# Patient Record
Sex: Male | Born: 1995 | Hispanic: Yes | Marital: Single | Smoking: Never smoker
Health system: Southern US, Community
[De-identification: ages and names within clinical notes are randomized; demographics above are authoritative.]

---

## 2019-05-30 ENCOUNTER — Other Ambulatory Visit: Payer: Self-pay

## 2019-05-30 ENCOUNTER — Emergency Department (HOSPITAL_COMMUNITY): Payer: 59

## 2019-05-30 ENCOUNTER — Observation Stay (HOSPITAL_COMMUNITY)
Admission: EM | Admit: 2019-05-30 | Discharge: 2019-05-31 | DRG: 987 | Disposition: A | Discharge status: Home / Self Care | Payer: 59 | Attending: General Surgery | Admitting: General Surgery

## 2019-05-30 ENCOUNTER — Encounter (HOSPITAL_COMMUNITY): Payer: Self-pay | Admitting: *Deleted

## 2019-05-30 DIAGNOSIS — T2114XA Burn of first degree of lower back, initial encounter: Secondary | ICD-10-CM | POA: Diagnosis present

## 2019-05-30 DIAGNOSIS — S22059A Unspecified fracture of T5-T6 vertebra, initial encounter for closed fracture: Secondary | ICD-10-CM | POA: Diagnosis present

## 2019-05-30 DIAGNOSIS — S42022A Displaced fracture of shaft of left clavicle, initial encounter for closed fracture: Secondary | ICD-10-CM | POA: Diagnosis not present

## 2019-05-30 DIAGNOSIS — T20212A Burn of second degree of left ear [any part, except ear drum], initial encounter: Secondary | ICD-10-CM | POA: Diagnosis not present

## 2019-05-30 DIAGNOSIS — Y92410 Unspecified street and highway as the place of occurrence of the external cause: Secondary | ICD-10-CM

## 2019-05-30 DIAGNOSIS — S36113A Laceration of liver, unspecified degree, initial encounter: Secondary | ICD-10-CM | POA: Diagnosis present

## 2019-05-30 DIAGNOSIS — S42025A Nondisplaced fracture of shaft of left clavicle, initial encounter for closed fracture: Secondary | ICD-10-CM

## 2019-05-30 DIAGNOSIS — U071 COVID-19: Secondary | ICD-10-CM | POA: Diagnosis present

## 2019-05-30 DIAGNOSIS — F1290 Cannabis use, unspecified, uncomplicated: Secondary | ICD-10-CM | POA: Diagnosis present

## 2019-05-30 DIAGNOSIS — S22049A Unspecified fracture of fourth thoracic vertebra, initial encounter for closed fracture: Secondary | ICD-10-CM | POA: Diagnosis not present

## 2019-05-30 DIAGNOSIS — R7989 Other specified abnormal findings of blood chemistry: Secondary | ICD-10-CM | POA: Diagnosis present

## 2019-05-30 DIAGNOSIS — R52 Pain, unspecified: Secondary | ICD-10-CM

## 2019-05-30 DIAGNOSIS — S2232XA Fracture of one rib, left side, initial encounter for closed fracture: Principal | ICD-10-CM | POA: Diagnosis present

## 2019-05-30 LAB — COMPREHENSIVE METABOLIC PANEL
ALT: 249 U/L — ABNORMAL HIGH (ref 0–44)
AST: 257 U/L — ABNORMAL HIGH (ref 15–41)
Albumin: 3.9 g/dL (ref 3.5–5.0)
Alkaline Phosphatase: 61 U/L (ref 38–126)
Anion gap: 11 (ref 5–15)
BUN: 11 mg/dL (ref 6–20)
CO2: 21 mmol/L — ABNORMAL LOW (ref 22–32)
Calcium: 9 mg/dL (ref 8.9–10.3)
Chloride: 107 mmol/L (ref 98–111)
Creatinine, Ser: 0.8 mg/dL (ref 0.61–1.24)
GFR calc Af Amer: >60 mL/min (ref >60)
GFR calc non Af Amer: >60 mL/min (ref >60)
Glucose, Bld: 121 mg/dL — ABNORMAL HIGH (ref 70–99)
Potassium: 3.3 mmol/L — ABNORMAL LOW (ref 3.5–5.1)
Sodium: 139 mmol/L (ref 135–145)
Total Bilirubin: 0.7 mg/dL (ref 0.3–1.2)
Total Protein: 6.3 g/dL — ABNORMAL LOW (ref 6.5–8.1)

## 2019-05-30 LAB — CBC
HCT: 42.6 % (ref 39.0–52.0)
HCT: 47.7 % (ref 39.0–52.0)
Hemoglobin: 14.3 g/dL (ref 13.0–17.0)
Hemoglobin: 16.1 g/dL (ref 13.0–17.0)
MCH: 29 pg (ref 26.0–34.0)
MCH: 29 pg (ref 26.0–34.0)
MCHC: 33.6 g/dL (ref 30.0–36.0)
MCHC: 33.8 g/dL (ref 30.0–36.0)
MCV: 86.4 fL (ref 80.0–100.0)
MCV: 85.9 fL (ref 80.0–100.0)
Platelets: 236 10*3/uL (ref 150–400)
Platelets: 242 10*3/uL (ref 150–400)
RBC: 4.93 MIL/uL (ref 4.22–5.81)
RBC: 5.55 MIL/uL (ref 4.22–5.81)
RDW: 12.6 % (ref 11.5–15.5)
RDW: 12.6 % (ref 11.5–15.5)
WBC: 19.3 10*3/uL — ABNORMAL HIGH (ref 4.0–10.5)
WBC: 17.7 10*3/uL — ABNORMAL HIGH (ref 4.0–10.5)
nRBC: 0 % (ref 0.0–0.2)
nRBC: 0 % (ref 0.0–0.2)

## 2019-05-30 LAB — URINALYSIS, ROUTINE W REFLEX MICROSCOPIC
Bilirubin Urine: NEGATIVE
Glucose, UA: NEGATIVE mg/dL
Ketones, ur: NEGATIVE mg/dL
Leukocytes,Ua: NEGATIVE
Nitrite: NEGATIVE
Protein, ur: 100 mg/dL — AB
Specific Gravity, Urine: >1.046 — ABNORMAL HIGH (ref 1.005–1.030)
pH: 5 (ref 5.0–8.0)

## 2019-05-30 LAB — SARS CORONAVIRUS 2 (TAT 6-24 HRS): SARS Coronavirus 2: POSITIVE — AB

## 2019-05-30 LAB — PROTIME-INR
INR: 1.1 (ref 0.8–1.2)
Prothrombin Time: 13.6 seconds (ref 11.4–15.2)

## 2019-05-30 LAB — I-STAT CHEM 8, ED
BUN: 11 mg/dL (ref 6–20)
Calcium, Ion: 1.13 mmol/L — ABNORMAL LOW (ref 1.15–1.40)
Chloride: 108 mmol/L (ref 98–111)
Creatinine, Ser: 0.6 mg/dL — ABNORMAL LOW (ref 0.61–1.24)
Glucose, Bld: 115 mg/dL — ABNORMAL HIGH (ref 70–99)
HCT: 42 % (ref 39.0–52.0)
Hemoglobin: 14.3 g/dL (ref 13.0–17.0)
Potassium: 3.1 mmol/L — ABNORMAL LOW (ref 3.5–5.1)
Sodium: 140 mmol/L (ref 135–145)
TCO2: 20 mmol/L — ABNORMAL LOW (ref 22–32)

## 2019-05-30 LAB — LACTIC ACID, PLASMA: Lactic Acid, Venous: 2.6 mmol/L (ref 0.5–1.9)

## 2019-05-30 LAB — CDS SEROLOGY

## 2019-05-30 LAB — ETHANOL: Alcohol, Ethyl (B): <10 mg/dL (ref <10)

## 2019-05-30 LAB — CARBOXYHEMOGLOBIN - COOX: Carboxyhemoglobin: 3.1 % — ABNORMAL HIGH (ref 0.5–1.5)

## 2019-05-30 LAB — HIV ANTIBODY (ROUTINE TESTING W REFLEX): HIV Screen 4th Generation wRfx: NONREACTIVE

## 2019-05-30 MED ORDER — DOCUSATE SODIUM 100 MG PO CAPS: 100.0000 mg | ORAL_CAPSULE | Freq: Two times a day (BID) | ORAL | Status: DC

## 2019-05-30 MED ORDER — LIDOCAINE-EPINEPHRINE (PF) 1 %-1:200000 IJ SOLN
10.0000 mL | Freq: Once | INTRAMUSCULAR | Status: DC
  Filled 2019-05-30: qty 10

## 2019-05-30 MED ORDER — HYDROMORPHONE HCL 1 MG/ML IJ SOLN
1.0000 mg | Freq: Once | INTRAMUSCULAR | Status: AC
  Administered 2019-05-30: 1 mg via INTRAVENOUS
  Filled 2019-05-30: qty 1

## 2019-05-30 MED ORDER — METOPROLOL TARTRATE 5 MG/5ML IV SOLN: 5.0000 mg | Freq: Four times a day (QID) | INTRAVENOUS | Status: DC | PRN

## 2019-05-30 MED ORDER — ONDANSETRON 4 MG PO TBDP: 4.0000 mg | ORAL_TABLET | Freq: Four times a day (QID) | ORAL | Status: DC | PRN

## 2019-05-30 MED ORDER — SODIUM CHLORIDE 0.9 % IV BOLUS
1000.0000 mL | Freq: Once | INTRAVENOUS | Status: AC
  Administered 2019-05-30: 1000 mL via INTRAVENOUS

## 2019-05-30 MED ORDER — ONDANSETRON HCL 4 MG/2ML IJ SOLN
4.0000 mg | Freq: Four times a day (QID) | INTRAMUSCULAR | Status: DC | PRN
  Filled 2019-05-30: qty 2

## 2019-05-30 MED ORDER — METHOCARBAMOL 500 MG PO TABS
500.0000 mg | ORAL_TABLET | Freq: Three times a day (TID) | ORAL | Status: DC | PRN
  Administered 2019-05-30 (×2): 500 mg via ORAL
  Filled 2019-05-30 (×2): qty 1

## 2019-05-30 MED ORDER — DOUBLE ANTIBIOTIC 500-10000 UNIT/GM EX OINT
TOPICAL_OINTMENT | Freq: Two times a day (BID) | CUTANEOUS | Status: DC
  Administered 2019-05-30 – 2019-05-31 (×3): via TOPICAL
  Filled 2019-05-30: qty 28.4

## 2019-05-30 MED ORDER — ONDANSETRON HCL 4 MG/2ML IJ SOLN
4.0000 mg | Freq: Once | INTRAMUSCULAR | Status: AC
  Administered 2019-05-30: 4 mg via INTRAVENOUS

## 2019-05-30 MED ORDER — HYDROMORPHONE HCL 1 MG/ML IJ SOLN
1.0000 mg | Freq: Once | INTRAMUSCULAR | Status: AC
  Administered 2019-05-30: 1 mg via INTRAVENOUS

## 2019-05-30 MED ORDER — HYDROMORPHONE HCL 1 MG/ML IJ SOLN
INTRAMUSCULAR | Status: AC
  Filled 2019-05-30: qty 1

## 2019-05-30 MED ORDER — OXYCODONE HCL 5 MG PO TABS
5.0000 mg | ORAL_TABLET | ORAL | Status: DC | PRN
  Administered 2019-05-30: 5 mg via ORAL
  Filled 2019-05-30: qty 1

## 2019-05-30 MED ORDER — POTASSIUM CHLORIDE IN NACL 20-0.45 MEQ/L-% IV SOLN
INTRAVENOUS | Status: DC
  Administered 2019-05-30 – 2019-05-31 (×2): via INTRAVENOUS
  Filled 2019-05-30 (×2): qty 1000

## 2019-05-30 MED ORDER — TETANUS-DIPHTH-ACELL PERTUSSIS 5-2.5-18.5 LF-MCG/0.5 IM SUSP
0.5000 mL | Freq: Once | INTRAMUSCULAR | Status: AC
  Administered 2019-05-30: 0.5 mL via INTRAMUSCULAR
  Filled 2019-05-30: qty 0.5

## 2019-05-30 MED ORDER — ACETAMINOPHEN 325 MG PO TABS: 650.0000 mg | ORAL_TABLET | ORAL | Status: DC | PRN

## 2019-05-30 MED ORDER — SILVER SULFADIAZINE 1 % EX CREA
TOPICAL_CREAM | Freq: Once | CUTANEOUS | Status: AC
  Administered 2019-05-30: 22:00:00 via TOPICAL
  Filled 2019-05-30: qty 85

## 2019-05-30 MED ORDER — LIDOCAINE HCL (PF) 1 % IJ SOLN
30.0000 mL | Freq: Once | INTRAMUSCULAR | Status: AC
  Administered 2019-05-30: 30 mL via INTRADERMAL

## 2019-05-30 MED ORDER — LIDOCAINE HCL (PF) 1 % IJ SOLN
INTRAMUSCULAR | Status: AC
  Administered 2019-05-30: 16:00:00
  Filled 2019-05-30: qty 30

## 2019-05-30 MED ORDER — IOHEXOL 300 MG/ML  SOLN
100.0000 mL | Freq: Once | INTRAMUSCULAR | Status: AC | PRN
  Administered 2019-05-30: 100 mL via INTRAVENOUS

## 2019-05-30 MED ORDER — ONDANSETRON HCL 4 MG/2ML IJ SOLN
INTRAMUSCULAR | Status: AC
  Administered 2019-05-30: 4 mg
  Filled 2019-05-30: qty 2

## 2019-05-30 MED ORDER — LIDOCAINE-EPINEPHRINE 1 %-1:100000 IJ SOLN
10.0000 mL | Freq: Once | INTRAMUSCULAR | Status: AC
  Administered 2019-05-30: 10 mL
  Filled 2019-05-30: qty 10

## 2019-05-30 MED ORDER — MORPHINE SULFATE (PF) 2 MG/ML IV SOLN
1.0000 mg | INTRAVENOUS | Status: DC | PRN
  Administered 2019-05-31 (×2): 2 mg via INTRAVENOUS
  Filled 2019-05-30 (×2): qty 1

## 2019-05-30 NOTE — ED Notes (Signed)
Attempted to call family again.

## 2019-05-30 NOTE — ED Notes (Signed)
Mother at bedside.
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
## 2019-05-30 NOTE — ED Notes (Signed)
Ordered diet tray for pt  
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
## 2019-05-30 NOTE — ED Notes (Signed)
Report given to Sherry RN

## 2019-05-30 NOTE — Progress Notes (Signed)
Called in regards to this patients CT results. Shows very subtle T4 and T5 superior endplate fractures, no posterior element involvement. These fractures are really too high in the thoracic spine to brace. Fractures are so subtle that I don't think they need bracing anyways. Can follow up  as outpatient after patient is discharged
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
## 2019-05-31 ENCOUNTER — Inpatient Hospital Stay (HOSPITAL_COMMUNITY): Payer: 59

## 2019-05-31 ENCOUNTER — Encounter (HOSPITAL_COMMUNITY): Payer: Self-pay

## 2019-05-31 LAB — COMPREHENSIVE METABOLIC PANEL
ALT: 163 U/L — ABNORMAL HIGH (ref 0–44)
AST: 86 U/L — ABNORMAL HIGH (ref 15–41)
Albumin: 3.9 g/dL (ref 3.5–5.0)
Alkaline Phosphatase: 57 U/L (ref 38–126)
Anion gap: 15 (ref 5–15)
BUN: 6 mg/dL (ref 6–20)
CO2: 22 mmol/L (ref 22–32)
Calcium: 9.4 mg/dL (ref 8.9–10.3)
Chloride: 101 mmol/L (ref 98–111)
Creatinine, Ser: 0.7 mg/dL (ref 0.61–1.24)
GFR calc Af Amer: >60 mL/min (ref >60)
GFR calc non Af Amer: >60 mL/min (ref >60)
Glucose, Bld: 112 mg/dL — ABNORMAL HIGH (ref 70–99)
Potassium: 3.8 mmol/L (ref 3.5–5.1)
Sodium: 138 mmol/L (ref 135–145)
Total Bilirubin: 1.3 mg/dL — ABNORMAL HIGH (ref 0.3–1.2)
Total Protein: 6.8 g/dL (ref 6.5–8.1)

## 2019-05-31 LAB — CBC
HCT: 42.3 % (ref 39.0–52.0)
HCT: 41.7 % (ref 39.0–52.0)
HCT: 44 % (ref 39.0–52.0)
Hemoglobin: 14.4 g/dL (ref 13.0–17.0)
Hemoglobin: 14.2 g/dL (ref 13.0–17.0)
Hemoglobin: 15.2 g/dL (ref 13.0–17.0)
MCH: 28.8 pg (ref 26.0–34.0)
MCH: 28.6 pg (ref 26.0–34.0)
MCH: 29 pg (ref 26.0–34.0)
MCHC: 34 g/dL (ref 30.0–36.0)
MCHC: 34.1 g/dL (ref 30.0–36.0)
MCHC: 34.5 g/dL (ref 30.0–36.0)
MCV: 84.6 fL (ref 80.0–100.0)
MCV: 84.1 fL (ref 80.0–100.0)
MCV: 84 fL (ref 80.0–100.0)
Platelets: 234 10*3/uL (ref 150–400)
Platelets: 209 10*3/uL (ref 150–400)
Platelets: 221 10*3/uL (ref 150–400)
RBC: 5 MIL/uL (ref 4.22–5.81)
RBC: 4.96 MIL/uL (ref 4.22–5.81)
RBC: 5.24 MIL/uL (ref 4.22–5.81)
RDW: 12.8 % (ref 11.5–15.5)
RDW: 12.8 % (ref 11.5–15.5)
RDW: 12.8 % (ref 11.5–15.5)
WBC: 11.6 10*3/uL — ABNORMAL HIGH (ref 4.0–10.5)
WBC: 8.9 10*3/uL (ref 4.0–10.5)
WBC: 8.9 10*3/uL (ref 4.0–10.5)
nRBC: 0 % (ref 0.0–0.2)
nRBC: 0 % (ref 0.0–0.2)
nRBC: 0 % (ref 0.0–0.2)

## 2019-05-31 LAB — SAMPLE TO BLOOD BANK

## 2019-05-31 MED ORDER — MORPHINE SULFATE (PF) 2 MG/ML IV SOLN: 1.0000 mg | INTRAVENOUS | Status: DC | PRN

## 2019-05-31 MED ORDER — DOCUSATE SODIUM 50 MG/5ML PO LIQD
100.0000 mg | Freq: Two times a day (BID) | ORAL | Status: DC
  Administered 2019-05-31: 100 mg via ORAL
  Filled 2019-05-31: qty 10

## 2019-05-31 MED ORDER — OXYCODONE HCL 5 MG/5ML PO SOLN: 5.0000 mg | ORAL | Status: DC | PRN

## 2019-05-31 MED ORDER — ACETAMINOPHEN 160 MG/5ML PO SOLN: 650.0000 mg | Freq: Four times a day (QID) | ORAL | 0 refills | Status: DC

## 2019-05-31 MED ORDER — OXYCODONE HCL 5 MG PO TABS: 5.0000 mg | ORAL_TABLET | ORAL | Status: DC | PRN

## 2019-05-31 MED ORDER — ACETAMINOPHEN 325 MG PO TABS: 650.0000 mg | ORAL_TABLET | Freq: Four times a day (QID) | ORAL | Status: DC

## 2019-05-31 MED ORDER — OXYCODONE HCL 5 MG/5ML PO SOLN: 5.0000 mg | Freq: Four times a day (QID) | ORAL | 0 refills | Status: DC | PRN

## 2019-05-31 MED ORDER — DOUBLE ANTIBIOTIC 500-10000 UNIT/GM EX OINT: 1.0000 "application " | TOPICAL_OINTMENT | Freq: Two times a day (BID) | CUTANEOUS | Status: AC

## 2019-05-31 MED ORDER — BACLOFEN 1 MG/ML ORAL SUSPENSION
10.0000 mg | Freq: Three times a day (TID) | ORAL | Status: DC
  Filled 2019-05-31 (×4): qty 1

## 2019-05-31 MED ORDER — ACETAMINOPHEN 160 MG/5ML PO SOLN
650.0000 mg | Freq: Four times a day (QID) | ORAL | Status: DC
  Administered 2019-05-31 (×2): 650 mg via ORAL
  Filled 2019-05-31 (×2): qty 20.3

## 2019-05-31 MED ORDER — DOCUSATE SODIUM 50 MG/5ML PO LIQD: 100.0000 mg | Freq: Two times a day (BID) | ORAL | 0 refills | Status: DC

## 2019-05-31 NOTE — Progress Notes (Signed)
Spoke with MD wakefield, M OK to DC patient. IV Removed and patient given dc papers.
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
## 2019-06-20 ENCOUNTER — Telehealth: Payer: Self-pay | Admitting: Plastic Surgery

## 2019-06-20 NOTE — Telephone Encounter (Signed)

## 2019-06-21 ENCOUNTER — Ambulatory Visit (INDEPENDENT_AMBULATORY_CARE_PROVIDER_SITE_OTHER): Payer: Self-pay | Admitting: Plastic Surgery

## 2019-06-21 ENCOUNTER — Other Ambulatory Visit: Payer: Self-pay

## 2019-06-21 ENCOUNTER — Encounter: Payer: Self-pay | Admitting: Plastic Surgery

## 2019-06-21 DIAGNOSIS — T20019A Burn of unspecified degree of unspecified ear [any part, except ear drum], initial encounter: Secondary | ICD-10-CM | POA: Insufficient documentation

## 2019-06-21 DIAGNOSIS — T20219A Burn of second degree of unspecified ear [any part, except ear drum], initial encounter: Secondary | ICD-10-CM
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
## 2019-12-27 ENCOUNTER — Ambulatory Visit: Payer: Self-pay | Admitting: Plastic Surgery
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
